# Patient Record
Sex: Male | Born: 1975 | Hispanic: Yes | Smoking: Never smoker
Health system: Southern US, Community
[De-identification: ages and names within clinical notes are randomized; demographics above are authoritative.]

---

## 2015-05-06 ENCOUNTER — Ambulatory Visit (INDEPENDENT_AMBULATORY_CARE_PROVIDER_SITE_OTHER): Payer: Self-pay | Admitting: Emergency Medicine

## 2015-05-06 VITALS — BP 130/92 | HR 53 | Temp 98.2°F | Resp 16 | Ht 64.0 in | Wt 153.0 lb

## 2015-05-06 DIAGNOSIS — L237 Allergic contact dermatitis due to plants, except food: Secondary | ICD-10-CM

## 2015-05-06 MED ORDER — METHYLPREDNISOLONE ACETATE 80 MG/ML IJ SUSP
80.0000 mg | Freq: Once | INTRAMUSCULAR | Status: AC
  Administered 2015-05-06: 80 mg via INTRAMUSCULAR

## 2015-05-06 NOTE — Patient Instructions (Signed)

## 2015-05-06 NOTE — Progress Notes (Signed)
Subjective:  Patient ID: Lonnie Blanchard, male    DOB: 1976/05/16  Age: 39 y.o. MRN: [id number]  CC: Poison Ivy   HPI Gates Ramirez-Pacheco presents  with a rash around his eyes. 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 Was swelling in the upper and lower lids in his cheeks. He's had no improvement in symptoms with over-the-counter medication. Is requesting a Medrol Dosepak a couple pills today and then save some for later. That was not an good idea. He has no other medical concerns currently  History Alonso has no past medical history on file.   He has no past surgical history on file.   His  family history includes Diabetes in his maternal aunt.  He   reports that he has never smoked. He does not have any smokeless tobacco history on file. He reports that he drinks about 33.6 oz of alcohol per week. He reports that he does not use illicit drugs.  No outpatient prescriptions prior to visit.   No facility-administered medications prior to visit.    History   Social History  . Marital Status: Single    Spouse Name: N/A  . Number of Children: N/A  . Years of Education: N/A   Social History Main Topics  . Smoking status: Never Smoker   . Smokeless tobacco: Not on file  . Alcohol Use: 33.6 oz/week    56 Standard drinks or equivalent per week  . Drug Use: No  . Sexual Activity: Not on file   Other Topics Concern  . None   Social History Narrative  . None     Review of Systems  Constitutional: Negative for fever, chills and appetite change.  HENT: Negative for congestion, ear pain, postnasal drip, sinus pressure and sore throat.   Eyes: Negative for pain and redness.  Respiratory: Negative for cough, shortness of breath and wheezing.   Cardiovascular: Negative for leg swelling.  Gastrointestinal: Negative for nausea, vomiting, abdominal pain, diarrhea, constipation and blood in stool.  Endocrine: Negative for polyuria.    Genitourinary: Negative for dysuria, urgency, frequency and flank pain.  Musculoskeletal: Negative for gait problem.  Skin: Positive for rash.  Neurological: Negative for weakness and headaches.  Psychiatric/Behavioral: Negative for confusion and decreased concentration. The patient is not nervous/anxious.     Objective:  BP 130/92 mmHg  Pulse 53  Temp(Src) 98.2 F (36.8 C) (Oral)  Resp 16  Ht 5\' 4"  (1.626 m)  Wt 153 lb (69.4 kg)  BMI 26.25 kg/m2  SpO2 98%  Physical Exam  Constitutional: He is oriented to person, place, and time. He appears well-developed and well-nourished. No distress.  HENT:  Head: Normocephalic and atraumatic.  Right Ear: External ear normal.  Left Ear: External ear normal.  Nose: Nose normal.  Eyes: Conjunctivae and EOM are normal. Pupils are equal, round, and reactive to light. No scleral icterus.  Neck: Normal range of motion. Neck supple. No tracheal deviation present.  Cardiovascular: Normal rate, regular rhythm and normal heart sounds.   Pulmonary/Chest: Effort normal. No respiratory distress. He has no wheezes. He has no rales.  Abdominal: He exhibits no mass. There is no tenderness. There is no rebound and no guarding.  Musculoskeletal: He exhibits no edema.  Lymphadenopathy:    He has no cervical adenopathy.  Neurological: He is alert and oriented to person, place, and time. Coordination normal.  Skin: Skin is warm and dry. Rash noted.  Psychiatric: He has a normal mood  and affect. His behavior is normal.      Assessment & Plan:   Montrae was seen today for poison ivy.  Diagnoses and all orders for this visit:  Poison ivy Orders: -     methylPREDNISolone acetate (DEPO-MEDROL) injection 80 mg; Inject 1 mL (80 mg total) into the muscle once.   We administered methylPREDNISolone acetate.  Meds ordered this encounter  Medications  . methylPREDNISolone acetate (DEPO-MEDROL) injection 80 mg    Sig:     Appropriate red flag conditions  were discussed with the patient as well as actions that should be taken.  Patient expressed his understanding.  Follow-up: Return if symptoms worsen or fail to improve.  Carmelina Dane, MD

## 2021-06-06 ENCOUNTER — Ambulatory Visit (HOSPITAL_COMMUNITY)
Admission: EM | Admit: 2021-06-06 | Discharge: 2021-06-06 | Disposition: A | Discharge status: Home / Self Care | Payer: Self-pay | Attending: Emergency Medicine | Admitting: Emergency Medicine

## 2021-06-06 ENCOUNTER — Ambulatory Visit (INDEPENDENT_AMBULATORY_CARE_PROVIDER_SITE_OTHER): Payer: Self-pay

## 2021-06-06 ENCOUNTER — Encounter (HOSPITAL_COMMUNITY): Payer: Self-pay

## 2021-06-06 ENCOUNTER — Other Ambulatory Visit: Payer: Self-pay

## 2021-06-06 DIAGNOSIS — M795 Residual foreign body in soft tissue: Secondary | ICD-10-CM

## 2021-06-06 DIAGNOSIS — T148XXA Other injury of unspecified body region, initial encounter: Secondary | ICD-10-CM

## 2021-06-06 DIAGNOSIS — M25551 Pain in right hip: Secondary | ICD-10-CM

## 2021-06-06 NOTE — ED Provider Notes (Signed)
[facility]    CSN: [id number] Arrival date & time: 06/06/21  1404      History   Chief Complaint Chief Complaint  Patient presents with   Foreign Body    HPI Osei Anger is a 45 y.o. male.   Pt was sitting in his work truck today. He has a hole in seat. Sat down on something and thought something sharp went into his back of upper rt thigh. Wife took tweezers to see if she was able to pull anything out. Abrasion to area no bleeding.    History reviewed. No pertinent past medical history.  There are no problems to display for this patient.   History reviewed. No pertinent surgical history.     Home Medications    Prior to Admission medications   Not on File    Family History Family History  Problem Relation Age of Onset   Diabetes Maternal Aunt     Social History Social History   Tobacco Use   Smoking status: Never  Substance Use Topics   Alcohol use: Yes    Alcohol/week: 56.0 standard drinks    Types: 56 Standard drinks or equivalent per week   Drug use: No     Allergies   Patient has no known allergies.   Review of Systems Review of Systems  Constitutional: Negative.   Respiratory: Negative.    Cardiovascular: Negative.   Gastrointestinal: Negative.   Skin:  Positive for wound.  Neurological: Negative.     Physical Exam Triage Vital Signs ED Triage Vitals  Enc Vitals Group     BP 06/06/21 1459 (!) 153/92     Pulse Rate 06/06/21 1459 (!) 56     Resp 06/06/21 1459 18     Temp 06/06/21 1459 98.2 F (36.8 C)     Temp Source 06/06/21 1459 Oral     SpO2 06/06/21 1459 97 %     Weight --      Height --      Head Circumference --      Peak Flow --      Pain Score 06/06/21 1455 0     Pain Loc --      Pain Edu? --      Excl. in GC? --    No data found.  Updated Vital Signs BP (!) 153/92 (BP Location: Left Arm)   Pulse (!) 56   Temp 98.2 F (36.8 C) (Oral)   Resp 18   SpO2 97%   Visual Acuity     Physical  Exam   UC Treatments / Results  Labs (all labs ordered are listed, but only abnormal results are displayed) Labs Reviewed - No data to display  EKG   Radiology No results found.  Procedures Procedures (including critical care time)  Medications Ordered in UC Medications - No data to display  Initial Impression / Assessment and Plan / UC Course  I have reviewed the triage vital signs and the nursing notes.  Pertinent labs & imaging results that were available during my care of the patient were reviewed by me and considered in my medical decision making (see chart for details).     Wash dry area  Keep covered  Small abrasion to area can apply neosporin to area  X ray was negative  Final Clinical Impressions(s) / UC Diagnoses   Final diagnoses:  Puncture wound  Abrasion     Discharge Instructions      Wash dry area well keep  covered  The x ray was negative       ED Prescriptions   None    PDMP not reviewed this encounter.   Coralyn Mark, NP 06/06/21 1600

## 2021-06-06 NOTE — Discharge Instructions (Addendum)
Wash dry area well keep covered  The x ray was negative

## 2021-06-06 NOTE — ED Triage Notes (Signed)
Pt states he has toothpick in back of right leg. Happened today per patient.

## 2022-09-12 IMAGING — DX DG HIP (WITH OR WITHOUT PELVIS) 2-3V*R*
2 series · 2 of 2 positions shown · non-contrast
Comparison: None.

CLINICAL DATA: Sharp pain near right buttock, concern for foreign
body

EXAM:
DG HIP (WITH OR WITHOUT PELVIS) 2-3V RIGHT

[hip ap]
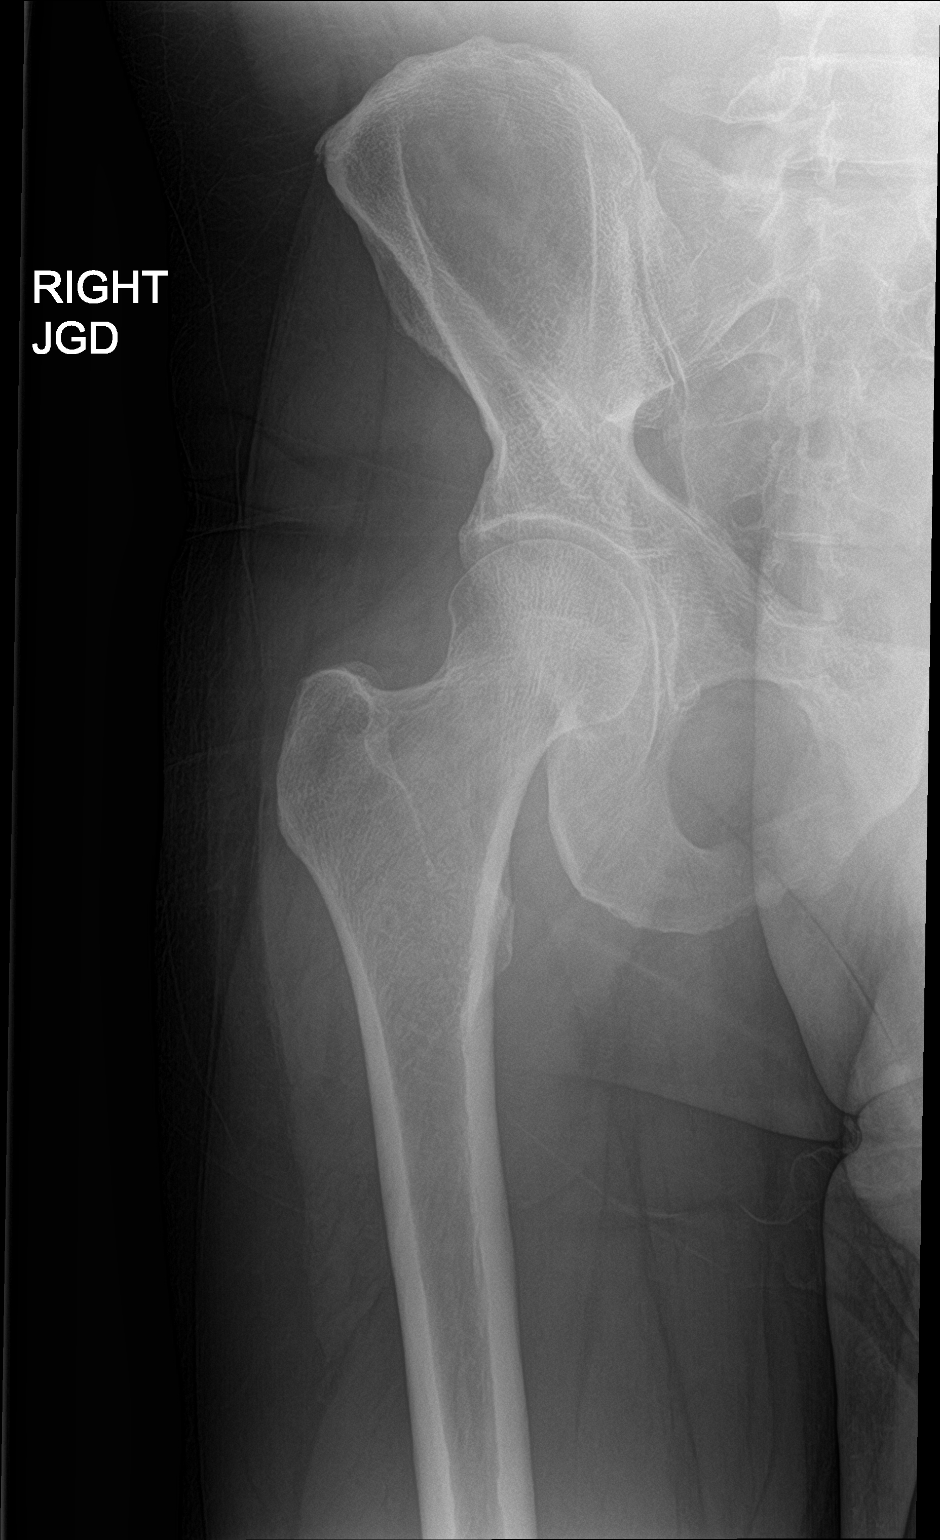

[hip lat]
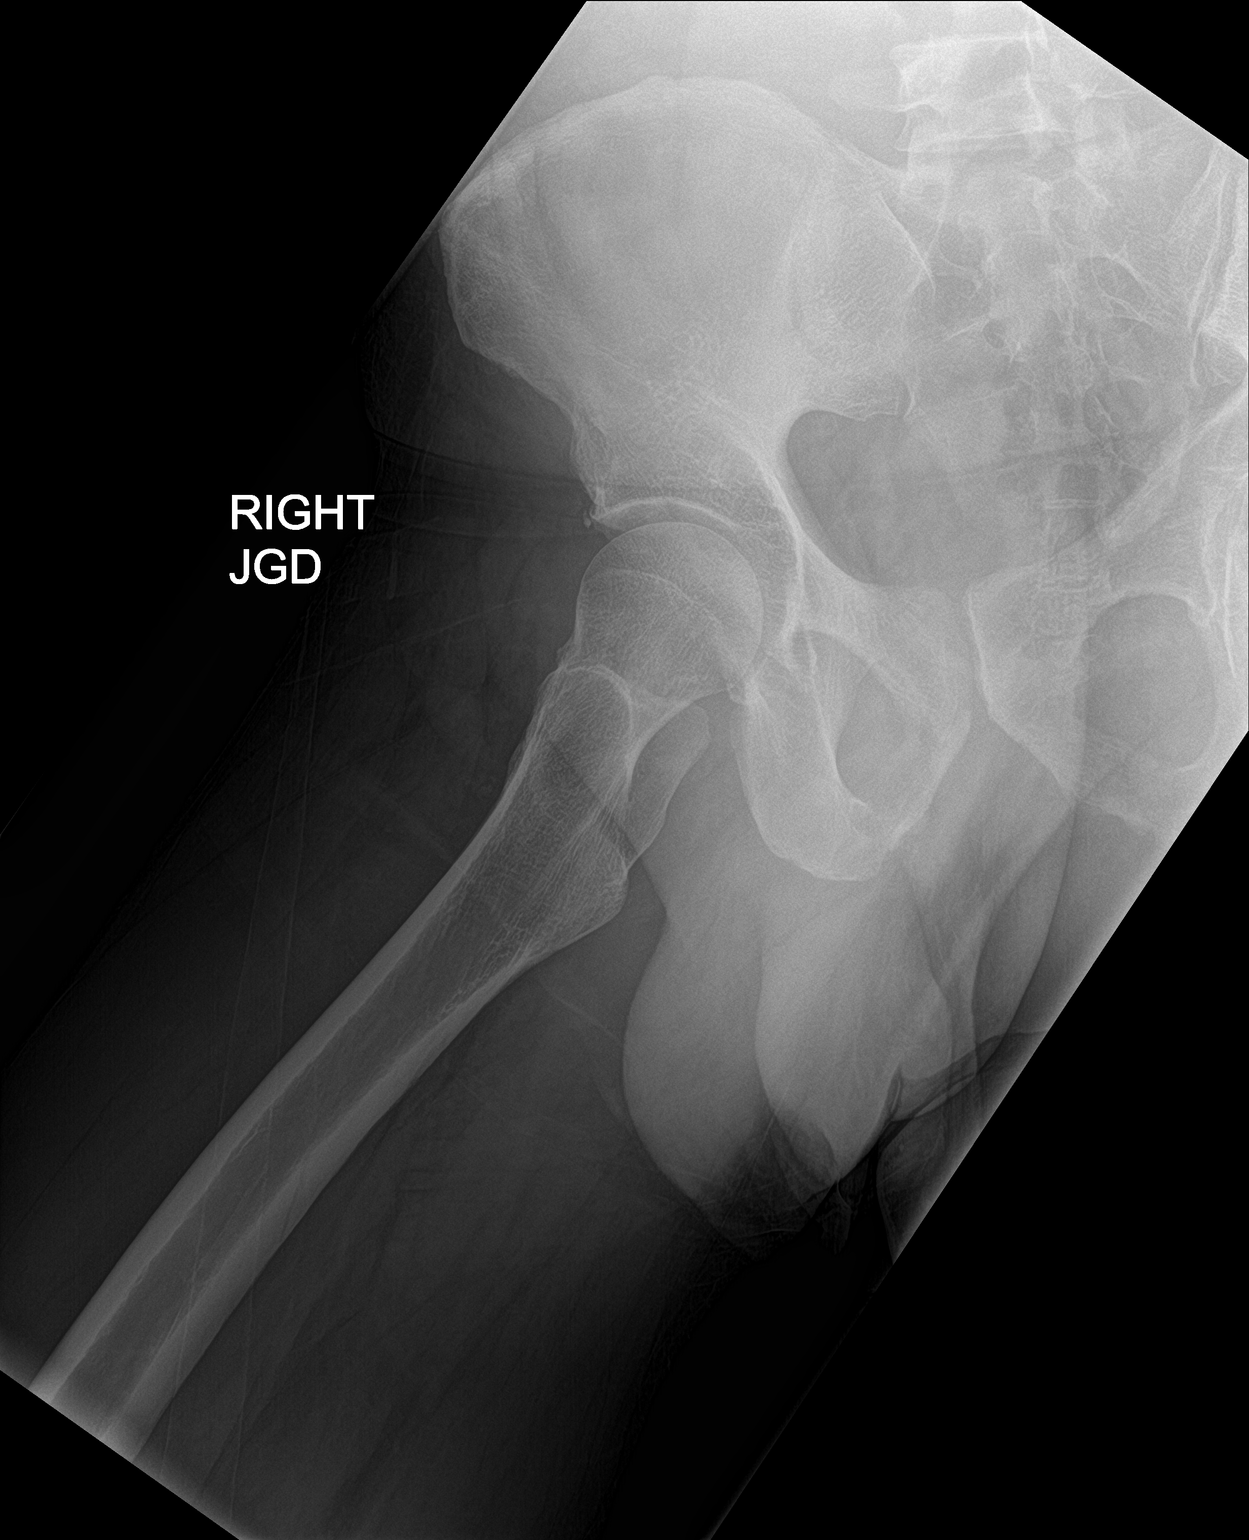

[2 of 2 positions shown; findings below may reference images not displayed]

FINDINGS: Frontal and frogleg lateral views of the right hip are obtained. No
fracture, subluxation, or dislocation. Joint spaces are well
preserved. The visualized soft tissues are unremarkable with no
evidence of radiopaque foreign body.
IMPRESSION: 1. No fracture or radiopaque foreign body.
